# Patient Record
Sex: Male | Born: 2000 | Race: White | Hispanic: No | Marital: Single | Smoking: Never smoker
Health system: Southern US, Community
[De-identification: ages and names within clinical notes are randomized; demographics above are authoritative.]

---
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
## 2020-06-12 ENCOUNTER — Emergency Department (HOSPITAL_COMMUNITY)
Admission: EM | Admit: 2020-06-12 | Discharge: 2020-06-13 | Discharge status: Against Medical Advice | Payer: Medicaid - Out of State

## 2020-10-07 ENCOUNTER — Other Ambulatory Visit: Payer: Self-pay

## 2020-10-07 ENCOUNTER — Emergency Department (HOSPITAL_COMMUNITY): Payer: Self-pay

## 2020-10-07 ENCOUNTER — Encounter (HOSPITAL_COMMUNITY): Payer: Self-pay | Admitting: Emergency Medicine

## 2020-10-07 ENCOUNTER — Emergency Department (HOSPITAL_COMMUNITY)
Admission: EM | Admit: 2020-10-07 | Discharge: 2020-10-07 | Disposition: A | Discharge status: Home / Self Care | Payer: Self-pay | Attending: Emergency Medicine | Admitting: Emergency Medicine

## 2020-10-07 DIAGNOSIS — F1010 Alcohol abuse, uncomplicated: Secondary | ICD-10-CM | POA: Insufficient documentation

## 2020-10-07 DIAGNOSIS — R0789 Other chest pain: Secondary | ICD-10-CM | POA: Insufficient documentation

## 2020-10-07 DIAGNOSIS — R200 Anesthesia of skin: Secondary | ICD-10-CM

## 2020-10-07 DIAGNOSIS — R202 Paresthesia of skin: Secondary | ICD-10-CM | POA: Insufficient documentation

## 2020-10-07 LAB — CBC WITH DIFFERENTIAL/PLATELET
Abs Immature Granulocytes: 0.04 10*3/uL (ref 0.00–0.07)
Basophils Absolute: 0 10*3/uL (ref 0.0–0.1)
Basophils Relative: 0 %
Eosinophils Absolute: 0.1 10*3/uL (ref 0.0–0.5)
Eosinophils Relative: 1 %
HCT: 47.4 % (ref 39.0–52.0)
Hemoglobin: 16 g/dL (ref 13.0–17.0)
Immature Granulocytes: 0 %
Lymphocytes Relative: 12 %
Lymphs Abs: 1.3 10*3/uL (ref 0.7–4.0)
MCH: 30.4 pg (ref 26.0–34.0)
MCHC: 33.8 g/dL (ref 30.0–36.0)
MCV: 89.9 fL (ref 80.0–100.0)
Monocytes Absolute: 0.9 10*3/uL (ref 0.1–1.0)
Monocytes Relative: 8 %
Neutro Abs: 8.3 10*3/uL — ABNORMAL HIGH (ref 1.7–7.7)
Neutrophils Relative %: 79 %
Platelets: 271 10*3/uL (ref 150–400)
RBC: 5.27 MIL/uL (ref 4.22–5.81)
RDW: 13.3 % (ref 11.5–15.5)
WBC: 10.8 10*3/uL — ABNORMAL HIGH (ref 4.0–10.5)
nRBC: 0 % (ref 0.0–0.2)

## 2020-10-07 LAB — URINALYSIS, ROUTINE W REFLEX MICROSCOPIC
Bilirubin Urine: NEGATIVE
Glucose, UA: NEGATIVE mg/dL
Hgb urine dipstick: NEGATIVE
Ketones, ur: NEGATIVE mg/dL
Leukocytes,Ua: NEGATIVE
Nitrite: NEGATIVE
Protein, ur: NEGATIVE mg/dL
Specific Gravity, Urine: 1.018 (ref 1.005–1.030)
pH: 8 (ref 5.0–8.0)

## 2020-10-07 LAB — COMPREHENSIVE METABOLIC PANEL
ALT: 20 U/L (ref 0–44)
AST: 27 U/L (ref 15–41)
Albumin: 4.6 g/dL (ref 3.5–5.0)
Alkaline Phosphatase: 77 U/L (ref 38–126)
Anion gap: 9 (ref 5–15)
BUN: 14 mg/dL (ref 6–20)
CO2: 27 mmol/L (ref 22–32)
Calcium: 9.4 mg/dL (ref 8.9–10.3)
Chloride: 101 mmol/L (ref 98–111)
Creatinine, Ser: 0.84 mg/dL (ref 0.61–1.24)
GFR, Estimated: >60 mL/min (ref >60)
Glucose, Bld: 100 mg/dL — ABNORMAL HIGH (ref 70–99)
Potassium: 4.3 mmol/L (ref 3.5–5.1)
Sodium: 137 mmol/L (ref 135–145)
Total Bilirubin: 1 mg/dL (ref 0.3–1.2)
Total Protein: 7.5 g/dL (ref 6.5–8.1)

## 2020-10-07 LAB — RAPID URINE DRUG SCREEN, HOSP PERFORMED
Amphetamines: NOT DETECTED
Barbiturates: NOT DETECTED
Benzodiazepines: NOT DETECTED
Cocaine: NOT DETECTED
Opiates: NOT DETECTED
Tetrahydrocannabinol: POSITIVE — AB

## 2020-10-07 LAB — TROPONIN I (HIGH SENSITIVITY): Troponin I (High Sensitivity): 2 ng/L (ref <18)

## 2020-10-07 LAB — MAGNESIUM: Magnesium: 2 mg/dL (ref 1.7–2.4)

## 2020-10-07 LAB — PROTIME-INR
INR: 1.1 (ref 0.8–1.2)
Prothrombin Time: 14 seconds (ref 11.4–15.2)

## 2020-10-07 LAB — ETHANOL: Alcohol, Ethyl (B): <10 mg/dL (ref <10)

## 2020-10-07 MED ORDER — CHLORDIAZEPOXIDE HCL 25 MG PO CAPS: ORAL_CAPSULE | ORAL | 0 refills | Status: AC

## 2020-10-07 MED ORDER — LACTATED RINGERS IV BOLUS
1000.0000 mL | Freq: Once | INTRAVENOUS | Status: AC
  Administered 2020-10-07: 1000 mL via INTRAVENOUS

## 2020-10-07 MED ORDER — THIAMINE HCL 100 MG/ML IJ SOLN
100.0000 mg | Freq: Once | INTRAMUSCULAR | Status: AC
  Administered 2020-10-07: 100 mg via INTRAVENOUS
  Filled 2020-10-07: qty 2

## 2020-10-07 NOTE — ED Provider Notes (Signed)
[facility] EMERGENCY DEPARTMENT Provider Note   CSN: [id number] Arrival date & time: 10/07/20  1324     History Chief Complaint  Patient presents with  . Chest Pain    Victor Mason is a 19 y.o. male with no past medical history who presents today for evaluation of multiple complaints. His primary concern today is that he has paresthesias in his lips intermittently.  This has been developing over the past week and has become much more frequent until today when he looked in the mirror and saw that the corners of his mouth looked slightly blue.  This was reportedly also a concern voiced by his girlfriend.  He states that he has had intermittent chest tightness and pain.  He states that he has had for over 1 year pain anytime he lays down in his chest feels like something is pulling.   He also reports significant alcohol consumption.  He states that last night he had half of a 4 Loco and about 14 Corona beers.  He states that that is a relatively light night for him and that he will often drinks 24 beers.  This has been escalating over the past 2 to 3 months.  He states that he started using alcohol when he stopped smoking weed "as often."  He reports that he is not had any yellowing of his eyes, no nausea vomiting or diarrhea.  He states that after his first Covid shot he got a panic attack and has not gotten his second.  He denies any recent sick exposures or fevers.  No coughing.  His chest tightness sometimes happens when he is anxious however is not always when he feels anxious.  He denies any exposure to any benzocaine or similar products.  He denies any exposure to anything that may be designed to produce a tingling affect including that his girlfriend does not use any lip pumping blood clots or lipstick or other products.  He does not take any medications.  He admits that he does have a problem with alcohol use. He denies any IV drug use.  HPI     History reviewed. No pertinent  past medical history.  There are no problems to display for this patient.   History reviewed. No pertinent surgical history.     History reviewed. No pertinent family history.  Social History   Tobacco Use  . Smoking status: Never Smoker  . Smokeless tobacco: Never Used  Substance Use Topics  . Alcohol use: No  . Drug use: No    Home Medications Prior to Admission medications   Medication Sig Start Date End Date Taking? Authorizing Provider  chlordiazePOXIDE (LIBRIUM) 25 MG capsule 50mg  PO TID x 1D, then 25-50mg  PO BID X 1D, then 25-50mg  PO QD X 1D 10/07/20   10/09/20, PA-C  fexofenadine-pseudoephedrine (ALLEGRA-D) 60-120 MG per tablet Take 1 tablet by mouth every 12 (twelve) hours. 09/14/14   11/14/14, PA-C  ibuprofen (ADVIL,MOTRIN) 200 MG tablet Take 400 mg by mouth every 6 (six) hours as needed for moderate pain.    [provider]  promethazine-dextromethorphan (PROMETHAZINE-DM) 6.25-15 MG/5ML syrup 7ml po hs, or  q6h prn cough 09/14/14   11/14/14, PA-C    Allergies    Amoxicillin  Review of Systems   Review of Systems  Constitutional: Negative for chills and fever.  HENT: Negative for congestion.   Eyes: Negative for pain and visual disturbance.  Respiratory: Positive for chest tightness and shortness of breath.  Negative for cough.   Cardiovascular: Positive for chest pain and palpitations. Negative for leg swelling.  Gastrointestinal: Negative for abdominal pain, diarrhea, nausea and vomiting.  Genitourinary: Negative for dysuria and urgency.  Musculoskeletal: Negative for back pain and neck pain.  Neurological: Negative for weakness and headaches.       He notes that over the past week his left upper eyelid has been intermittently twitching.  Psychiatric/Behavioral: The patient is nervous/anxious.   All other systems reviewed and are negative.   Physical Exam Updated Vital Signs BP (!) 141/88   Pulse (!) 56   Temp 97.7 F  (36.5 C) (Oral)   Resp 14   Ht 6\' 1"  (1.854 m)   Wt 81.6 kg   SpO2 98%   BMI 23.75 kg/m   Physical Exam Vitals and nursing note reviewed.  Constitutional:      General: He is not in acute distress.    Appearance: He is well-developed.  HENT:     Head: Normocephalic and atraumatic.  Eyes:     Conjunctiva/sclera: Conjunctivae normal.  Cardiovascular:     Rate and Rhythm: Normal rate and regular rhythm.     Pulses:          Radial pulses are 2+ on the right side and 2+ on the left side.       Dorsalis pedis pulses are 2+ on the right side and 2+ on the left side.       Posterior tibial pulses are 2+ on the right side and 2+ on the left side.     Heart sounds: Normal heart sounds. No murmur heard.   Pulmonary:     Effort: Pulmonary effort is normal. No respiratory distress.     Breath sounds: Normal breath sounds. No decreased breath sounds, wheezing, rhonchi or rales.  Chest:     Chest wall: No tenderness.  Abdominal:     Palpations: Abdomen is soft.     Tenderness: There is no abdominal tenderness.  Musculoskeletal:     Cervical back: Neck supple.     Right lower leg: No tenderness. No edema.     Left lower leg: No tenderness. No edema.  Skin:    General: Skin is warm and dry.  Neurological:     General: No focal deficit present.     Mental Status: He is alert and oriented to person, place, and time.     Comments: No visible twitching of the eyelids including the left upper eyelid.  He reports subjective decreased sensation periorally however otherwise sensation is intact to light touch on bilateral face.  Spontaneous movement of all 4 extremities.  Coordination is grossly intact as he is able to text on his phone without difficulties.  Psychiatric:        Mood and Affect: Mood is anxious.        Behavior: Behavior normal.     ED Results / Procedures / Treatments   Labs (all labs ordered are listed, but only abnormal results are displayed) Labs Reviewed    COMPREHENSIVE METABOLIC PANEL - Abnormal; Notable for the following components:      Result Value   Glucose, Bld 100 (*)    All other components within normal limits  CBC WITH DIFFERENTIAL/PLATELET - Abnormal; Notable for the following components:   WBC 10.8 (*)    Neutro Abs 8.3 (*)    All other components within normal limits  RAPID URINE DRUG SCREEN, HOSP PERFORMED - Abnormal; Notable for the following components:  Tetrahydrocannabinol POSITIVE (*)    All other components within normal limits  ETHANOL  PROTIME-INR  MAGNESIUM  URINALYSIS, ROUTINE W REFLEX MICROSCOPIC  TROPONIN I (HIGH SENSITIVITY)    EKG EKG Interpretation  Date/Time:  Sunday October 07 2020 13:30:48 EDT Ventricular Rate:  63 PR Interval:    QRS Duration: 119 QT Interval:  407 QTC Calculation: 417 R Axis:   133 Text Interpretation: Sinus arrhythmia Nonspecific intraventricular conduction delay Anterior infarct, possibly acute diffuse ST elevation no reciprocal changes Confirmed by Eber Hong ([id number]) on 10/07/2020 1:33:29 PM   Radiology DG Chest Portable 1 View  Result Date: 10/07/2020 CLINICAL DATA:  Chest pain. EXAM: PORTABLE CHEST 1 VIEW COMPARISON:  09/14/2014 FINDINGS: The heart size and mediastinal contours are within normal limits. Both lungs are clear. No visible pleural effusions or pneumothorax. Upper thoracic reverse S-shaped curvature. No acute osseous abnormality. IMPRESSION: No acute cardiopulmonary disease. Electronically Signed   By: Feliberto Harts MD   On: 10/07/2020 14:48    Procedures Procedures (including critical care time)  Medications Ordered in ED Medications  thiamine (B-1) injection 100 mg (100 mg Intravenous Given 10/07/20 1428)  lactated ringers bolus 1,000 mL (0 mLs Intravenous Stopped 10/07/20 1651)    ED Course  I have reviewed the triage vital signs and the nursing notes.  Pertinent labs & imaging results that were available during my care of the patient were  reviewed by me and considered in my medical decision making (see chart for details).    MDM Rules/Calculators/A&P                         Patient is a 19 year old man who presents today for evaluation of multiple complaints. His primary concern was the constellation of twitching of his left upper eyelid, chest tightness, perioral paresthesias along with reports of his lips being blue earlier today, intermittent chest pains. He reports significant alcohol use.  He denies any benzocaine or other similar medications, doubt methemoglobinemia.  He reports that aside from alcohol and marijuana does not use any other drugs currently. Lungs are clear to auscultation bilaterally.  He is neurovascularly intact on my exam. He does appear anxious. Labs are obtained and reviewed showing mild leukocytosis at 10.8 however otherwise CBC and CMP are unremarkable. UDS is positive for THC. Troponin is not elevated. INR and LFTs are normal. Magnesium is normal. Ethanol is undetected. Patient was treated with IV thiamine and 1 L of IV fluids after which he stated that he felt significantly improved and ready to go home. We discussed that I suspect his symptoms are related to a combination of alcohol use, anxiety/stress along with dehydration. EKG does not have clear ischemia, given his troponin is 2 and his symptoms all resolved after IV fluids and thiamine doubt pericarditis, myocarditis or ischemic cause of his symptoms. We discussed options for stopping drinking. It sounds like that patient has used some substance in some sort for most of his adult life. I discussed need for counseling, patient states his understanding. We discussed role of Librium taper. Discussed not drinking while on Librium along with safety precautions. Librium Rx is sent. Work note is given.  Return precautions were discussed with patient who states their understanding.  At the time of discharge patient denied any unaddressed complaints or concerns.   Patient is agreeable for discharge home.  Note: Portions of this report may have been transcribed using voice recognition software. Every effort was made to ensure accuracy;  however, inadvertent computerized transcription errors may be present.    Final Clinical Impression(s) / ED Diagnoses Final diagnoses:  Atypical chest pain  Circumoral numbness  Alcohol abuse    Rx / DC Orders ED Discharge Orders         Ordered    chlordiazePOXIDE (LIBRIUM) 25 MG capsule        10/07/20 1552           Cristina GongHammond, Anvitha Hutmacher W, Cordelia Poche-C 10/07/20 2336    Eber HongMiller, Brian, MD 10/08/20 [phone]

## 2020-10-07 NOTE — ED Triage Notes (Signed)
Pt c/o of "chest fluttering" and lip numbness since yesterday

## 2020-10-07 NOTE — Discharge Instructions (Addendum)
You are being prescribed a medication which may make you sleepy. For 24 hours after one dose please do not drive, operate heavy machinery, care for a small child with out another adult present, or perform any activities that may cause harm to you or someone else if you were to fall asleep or be impaired.   Today we discussed that I suspect your symptoms are caused by primarily a combination of alcohol use along with dehydration and anxiety.  Given you the information to follow-up with the wellness clinic, to set up primary care.  Please call them Monday morning to set up an appointment.  I have given you a prescription for Librium, as we discussed this is a sedating medication however will try and help treat alcohol withdrawal related symptoms and significantly reduce your risk of having alcohol withdrawal related seizures.  Please make sure you tell someone who lives with you or a close friend what is going on so that they can check in on you.    Do not drink alcohol while you are taking Librium.  Please consider getting counseling or therapy in addition to help treat underlying reasons for why you may use substances. Additionally I would recommend that you stop using marijuana as, unless you grow unprocessed that yourself you do not know what other things may be mixed in.  Please start taking a multivitamin every day and eating a healthy, well-balanced diet.

## 2022-01-22 ENCOUNTER — Other Ambulatory Visit: Payer: Self-pay

## 2022-01-22 ENCOUNTER — Emergency Department (HOSPITAL_BASED_OUTPATIENT_CLINIC_OR_DEPARTMENT_OTHER)
Admission: EM | Admit: 2022-01-22 | Discharge: 2022-01-22 | Disposition: A | Discharge status: Home / Self Care | Payer: No Typology Code available for payment source | Attending: Emergency Medicine | Admitting: Emergency Medicine

## 2022-01-22 ENCOUNTER — Emergency Department (HOSPITAL_BASED_OUTPATIENT_CLINIC_OR_DEPARTMENT_OTHER): Payer: No Typology Code available for payment source | Admitting: Radiology

## 2022-01-22 DIAGNOSIS — Y9355 Activity, bike riding: Secondary | ICD-10-CM | POA: Insufficient documentation

## 2022-01-22 DIAGNOSIS — F10929 Alcohol use, unspecified with intoxication, unspecified: Secondary | ICD-10-CM | POA: Insufficient documentation

## 2022-01-22 DIAGNOSIS — M79671 Pain in right foot: Secondary | ICD-10-CM | POA: Insufficient documentation

## 2022-01-22 DIAGNOSIS — Y9289 Other specified places as the place of occurrence of the external cause: Secondary | ICD-10-CM | POA: Diagnosis not present

## 2022-01-22 MED ORDER — NAPROXEN 375 MG PO TABS
375.0000 mg | ORAL_TABLET | Freq: Two times a day (BID) | ORAL | 0 refills | Status: AC
Start: 2022-01-22 — End: ?

## 2022-01-22 MED ORDER — NAPROXEN 375 MG PO TABS: 375.0000 mg | ORAL_TABLET | Freq: Two times a day (BID) | ORAL | 0 refills | Status: DC
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
## 2022-01-22 NOTE — ED Notes (Signed)
Patient given discharge instructions. Questions were answered. Patient verbalized understanding of discharge instructions and care at home.  Patient right foot ace wrapped and boot placed.
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
## 2022-01-22 NOTE — Discharge Instructions (Signed)
You have been seen and discharged from the emergency department.  Your x-ray was negative for fracture.  You have a foot sprain.  Keep Ace wrap in place for compression support.  Elevate and ice the foot to avoid significant swelling.  Take medication as needed for pain control.  Follow-up with your primary provider for further evaluation and further care. Take home medications as prescribed. If you have any worsening symptoms or further concerns for your health please return to an emergency department for further evaluation.
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
## 2022-01-22 NOTE — ED Notes (Signed)
Ace wrap applied to right foot.

## 2022-05-19 IMAGING — DX DG FOOT COMPLETE 3+V*R*
2 series · 3 of 3 positions shown · non-contrast
Comparison: None.

CLINICAL DATA: Right foot pain after injury.

EXAM:
RIGHT FOOT COMPLETE - 3+ VIEW

[Series 1: foot · 0.14mm/px · 2 of 2 slices shown]
[im 1/2]
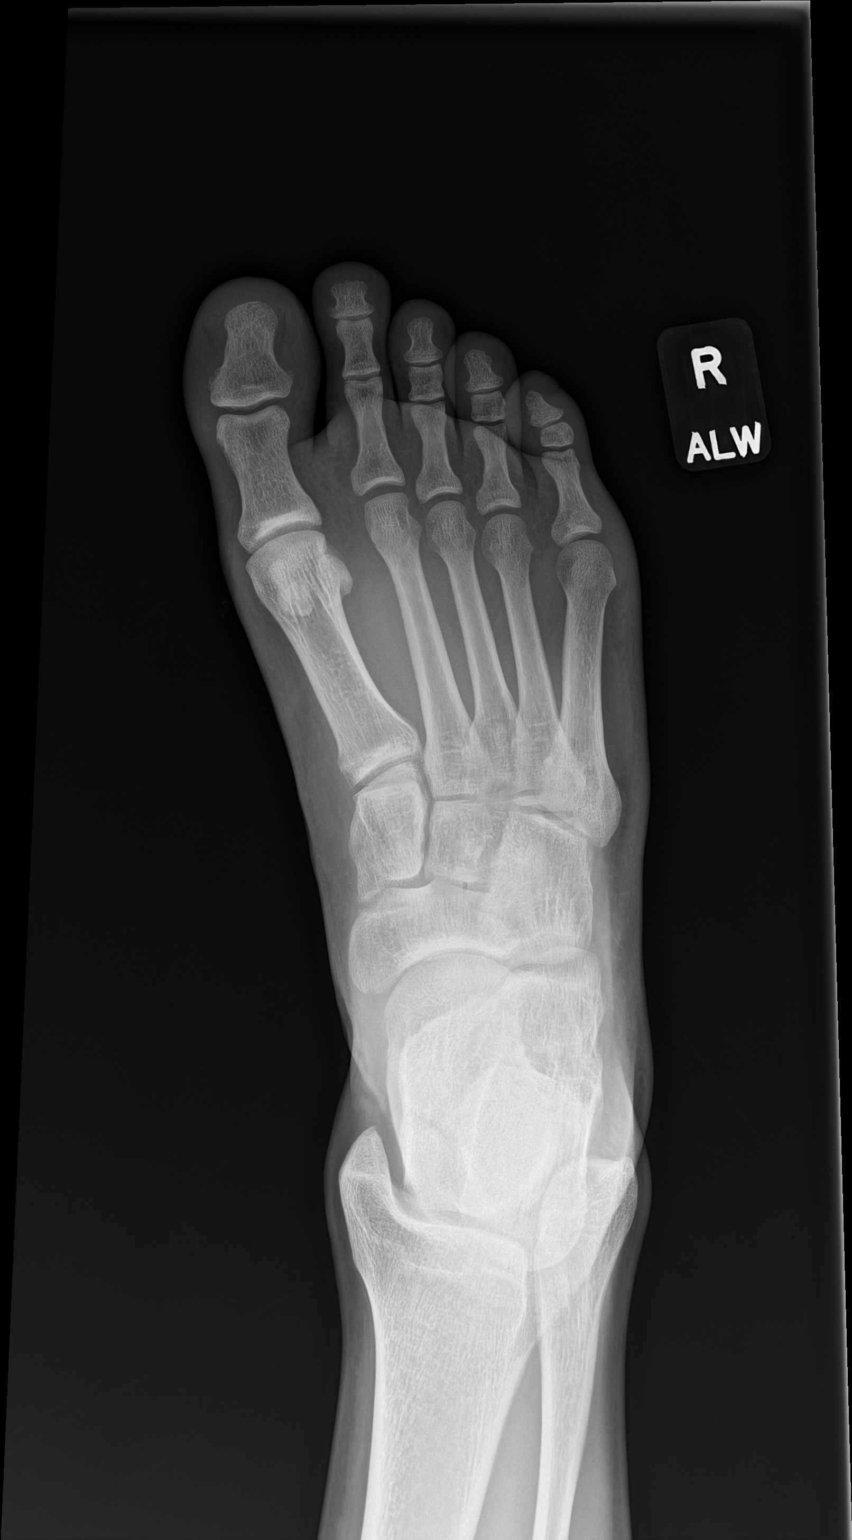
[im 2/2]
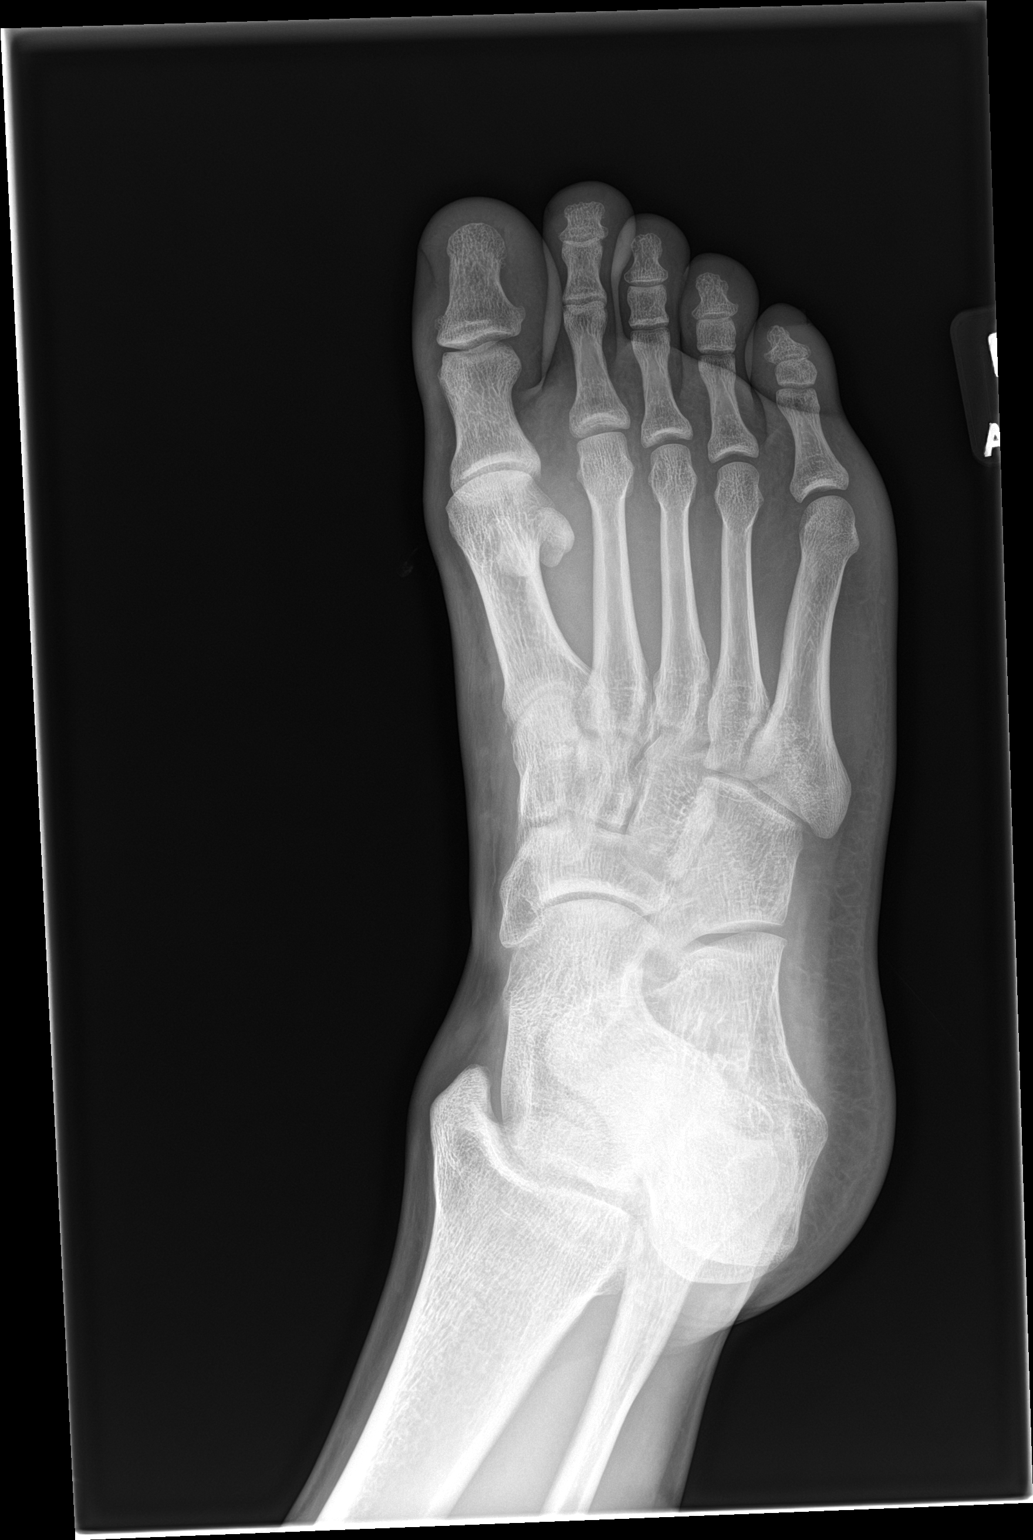

[leg]
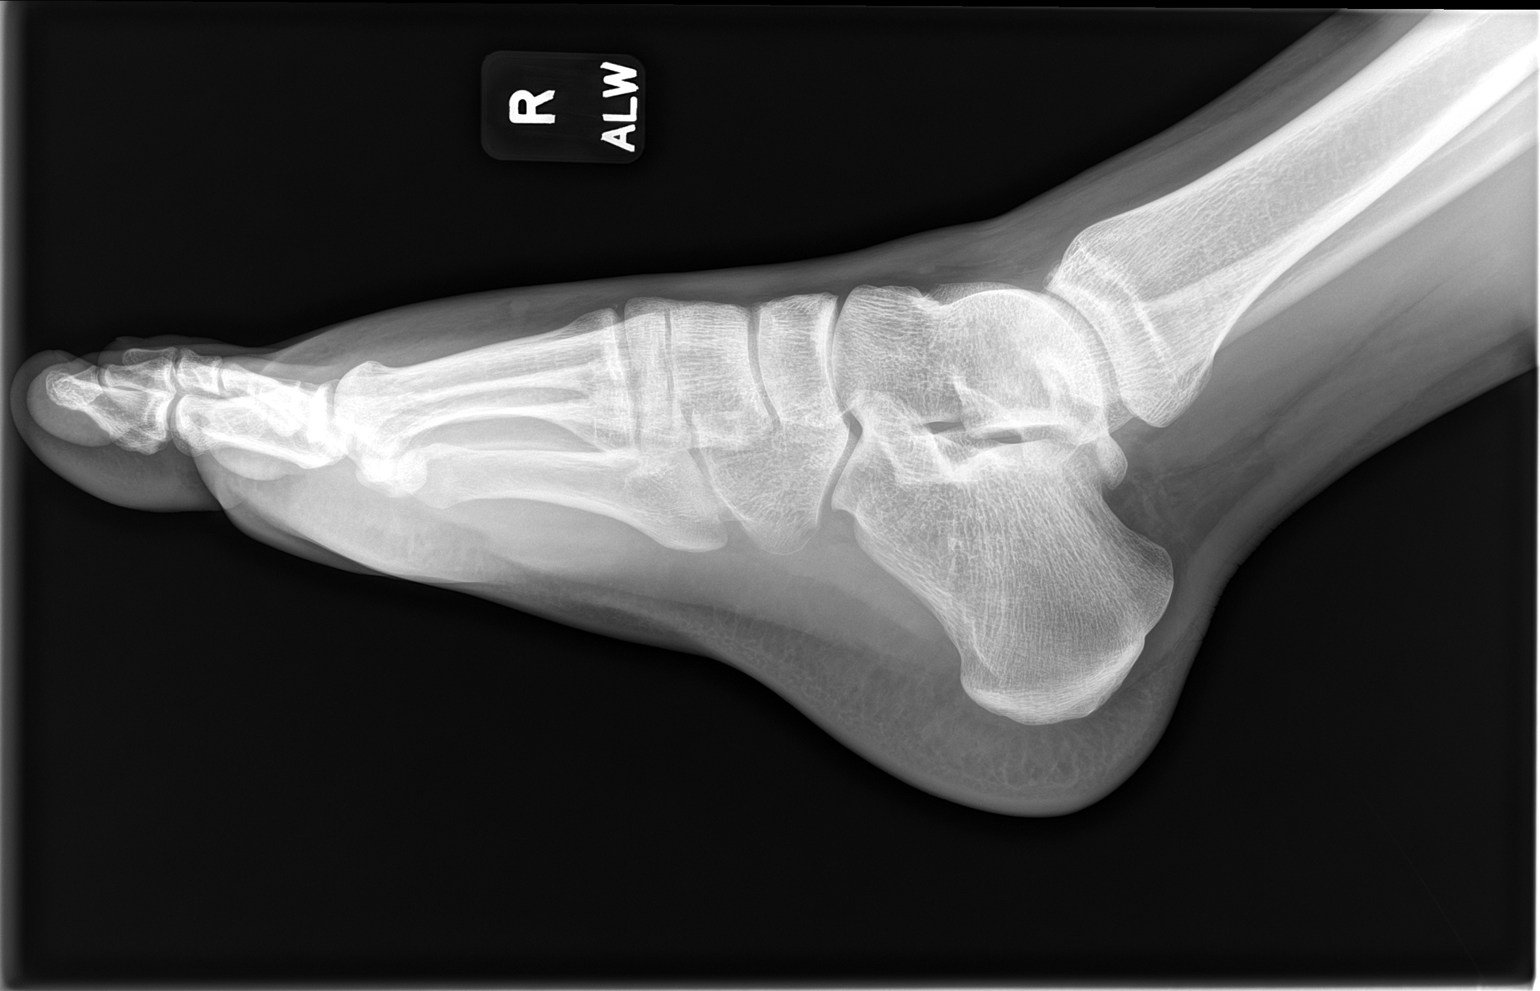

[3 of 3 positions shown; findings below may reference images not displayed]

FINDINGS: There is no evidence of fracture or dislocation. There is no
evidence of arthropathy or other focal bone abnormality. Soft
tissues are unremarkable.
IMPRESSION: Negative.

## 2023-04-03 ENCOUNTER — Ambulatory Visit
Admission: EM | Admit: 2023-04-03 | Discharge: 2023-04-03 | Disposition: A | Discharge status: Home / Self Care | Payer: Medicaid Other | Attending: Emergency Medicine | Admitting: Emergency Medicine

## 2023-04-03 DIAGNOSIS — R21 Rash and other nonspecific skin eruption: Secondary | ICD-10-CM | POA: Insufficient documentation

## 2023-04-03 DIAGNOSIS — Z113 Encounter for screening for infections with a predominantly sexual mode of transmission: Secondary | ICD-10-CM | POA: Diagnosis not present

## 2023-04-03 MED ORDER — VALACYCLOVIR HCL 1 G PO TABS: 1000.0000 mg | ORAL_TABLET | Freq: Three times a day (TID) | ORAL | 0 refills | Status: AC

## 2023-04-03 NOTE — ED Provider Notes (Signed)
Victor Mason    CSN: [id number] Arrival date & time: 04/03/23  1752      History   Chief Complaint Chief Complaint  Patient presents with   Rash    HPI Victor Mason is a 22 y.o. male.  Patient presents with a rash in his genital area with an ulcer on his penis x 1 week.  He now has a rash on his trunk and extremities.  He recently had unprotected sexual intercourse and is concerned for genital herpes.  He denies penile discharge, testicular pain, abdominal pain, dysuria, fever, or other symptoms.  No rash in his mouth.  He denies pertinent medical history.  The history is provided by the patient and medical records.    History reviewed. No pertinent past medical history.  There are no problems to display for this patient.   History reviewed. No pertinent surgical history.     Home Medications    Prior to Admission medications   Medication Sig Start Date End Date Taking? Authorizing Provider  valACYclovir (VALTREX) 1000 MG tablet Take 1 tablet (1,000 mg total) by mouth 3 (three) times daily. 04/03/23  Yes Mickie Bail, NP  chlordiazePOXIDE (LIBRIUM) 25 MG capsule  PO TID x 1D, then 25-50mg  PO BID X 1D, then 25-50mg  PO QD X 1D Patient not taking: Reported on 04/03/2023 10/07/20   Cristina Gong, PA-C  fexofenadine-pseudoephedrine (ALLEGRA-D) 60-120 MG per tablet Take 1 tablet by mouth every 12 (twelve) hours. Patient not taking: Reported on 04/03/2023 09/14/14   Ivery Quale, PA-C  naproxen (NAPROSYN) 375 MG tablet Take 1 tablet (375 mg total) by mouth 2 (two) times daily. Patient not taking: Reported on 04/03/2023 01/22/22   Horton, Clabe Seal, DO  promethazine-dextromethorphan (PROMETHAZINE-DM) 6.25-15 MG/5ML syrup 5ml po hs, or  q6h prn cough Patient not taking: Reported on 04/03/2023 09/14/14   Ivery Quale, PA-C    Family History No family history on file.  Social History Social History   Tobacco Use   Smoking status: Never   Smokeless tobacco:  Never  Substance Use Topics   Alcohol use: No   Drug use: No     Allergies   Amoxicillin   Review of Systems Review of Systems  Constitutional:  Negative for chills and fever.  HENT:  Negative for ear pain and sore throat.   Gastrointestinal:  Negative for abdominal pain, nausea and vomiting.  Genitourinary:  Negative for dysuria, flank pain, hematuria, penile discharge and testicular pain.  Skin:  Positive for rash.  All other systems reviewed and are negative.    Physical Exam Triage Vital Signs ED Triage Vitals  Enc Vitals Group     BP 04/03/23 1900 (!) 141/84     Pulse Rate 04/03/23 1842 76     Resp 04/03/23 1842 18     Temp 04/03/23 1842 98.2 F (36.8 C)     Temp src --      SpO2 04/03/23 1842 99 %     Weight 04/03/23 1858 180 lb (81.6 kg)     Height 04/03/23 1858  (1.88 m)     Head Circumference --      Peak Flow --      Pain Score 04/03/23 1855 4     Pain Loc --      Pain Edu? --      Excl. in GC? --    No data found.  Updated Vital Signs BP (!) 141/84   Pulse 76   Temp  98.2 F (36.8 C)   Resp 18   Ht 6\' 2"  (1.88 m)   Wt 180 lb (81.6 kg)   SpO2 99%   BMI 23.11 kg/m   Visual Acuity Right Eye Distance:   Left Eye Distance:   Bilateral Distance:    Right Eye Near:   Left Eye Near:    Bilateral Near:     Physical Exam Vitals and nursing note reviewed. Exam conducted with a chaperone present [name], Charity fundraiser).  Constitutional:      General: He is not in acute distress.    Appearance: Normal appearance. He is well-developed. He is not ill-appearing.  HENT:     Head: Normocephalic and atraumatic.     Mouth/Throat:     Mouth: Mucous membranes are moist.     Pharynx: Oropharynx is clear.  Cardiovascular:     Rate and Rhythm: Normal rate and regular rhythm.     Heart sounds: Normal heart sounds.  Pulmonary:     Effort: Pulmonary effort is normal. No respiratory distress.     Breath sounds: Normal breath sounds.  Abdominal:     General:  Bowel sounds are normal.     Palpations: Abdomen is soft.     Tenderness: There is no abdominal tenderness. There is no right CVA tenderness, left CVA tenderness, guarding or rebound.  Genitourinary:    Pubic Area: Rash present.     Testes: Normal.       Comments: Scattered papular rash on genital area.  Musculoskeletal:     Cervical back: Neck supple.  Skin:    General: Skin is warm and dry.     Findings: Rash present.     Comments: Scattered papular rash on trunk and extremities.   Neurological:     Mental Status: He is alert.  Psychiatric:        Mood and Affect: Mood normal.        Behavior: Behavior normal.      UC Treatments / Results  Labs (all labs ordered are listed, but only abnormal results are displayed) Labs Reviewed  HSV CULTURE AND TYPING  CYTOLOGY, (ORAL, ANAL, URETHRAL) ANCILLARY ONLY    EKG   Radiology No results found.  Procedures Procedures (including critical care time)  Medications Ordered in UC Medications - No data to display  Initial Impression / Assessment and Plan / UC Course  I have reviewed the triage vital signs and the nursing notes.  Pertinent labs & imaging results that were available during my care of the patient were reviewed by me and considered in my medical decision making (see chart for details).    Penile rash, body rash, STD screening.  Penile lesion swabbed for HSV.  Urethra swabbed for other STD testing.  Reading today with Valtrex.  Instructed patient to discontinue this medication if his HSV test is negative.  Instructed him to abstain from sexual activity for at least 7 days and until the rash is resolved.  Education provided on genital herpes and also on generalized body rash.  Instructed him to follow-up with his PCP.  He agrees to plan of care.  Final Clinical Impressions(s) / UC Diagnoses   Final diagnoses:  Penile rash  Rash  Screening for STD (sexually transmitted disease)     Discharge Instructions       Take the Valtrex as directed.    Your tests are pending.  If your test results are positive, we will call you.  You and your sexual partner(s) may  require treatment at that time.  Do not have sexual activity for at least 7 days and until the rash has resolved.         ED Prescriptions     Medication Sig Dispense Auth. Provider   valACYclovir (VALTREX) 1000 MG tablet Take 1 tablet (1,000 mg total) by mouth 3 (three) times daily. 21 tablet Mickie Bail, NP      PDMP not reviewed this encounter.   Mickie Bail, NP 04/03/23 1944

## 2023-04-03 NOTE — Discharge Instructions (Addendum)
Take the Valtrex as directed.    Your tests are pending.  If your test results are positive, we will call you.  You and your sexual partner(s) may require treatment at that time.  Do not have sexual activity for at least 7 days and until the rash has resolved.

## 2023-04-03 NOTE — ED Triage Notes (Addendum)
Patient to Urgent Care with complaints of rash present to hands and legs that started several days ago. Small lesions present to palms of hands/ wrist/ knee/ shins. States that the areas are painful/ sore but has not noticed any drainage. Also has sores present to his genital area. Denies any fevers/ other symptoms.   Reports concerns that he was exposed to something.   Reports only known allergy is to amoxicillin.

## 2023-04-06 ENCOUNTER — Emergency Department (HOSPITAL_COMMUNITY)
Admission: EM | Admit: 2023-04-06 | Discharge: 2023-04-07 | Disposition: A | Discharge status: Home / Self Care | Payer: Medicaid Other | Attending: Emergency Medicine | Admitting: Emergency Medicine

## 2023-04-06 ENCOUNTER — Other Ambulatory Visit: Payer: Self-pay

## 2023-04-06 ENCOUNTER — Encounter (HOSPITAL_COMMUNITY): Payer: Self-pay | Admitting: *Deleted

## 2023-04-06 DIAGNOSIS — Z114 Encounter for screening for human immunodeficiency virus [HIV]: Secondary | ICD-10-CM | POA: Insufficient documentation

## 2023-04-06 DIAGNOSIS — R21 Rash and other nonspecific skin eruption: Secondary | ICD-10-CM | POA: Insufficient documentation

## 2023-04-06 DIAGNOSIS — Z7251 High risk heterosexual behavior: Secondary | ICD-10-CM | POA: Insufficient documentation

## 2023-04-06 LAB — CYTOLOGY, (ORAL, ANAL, URETHRAL) ANCILLARY ONLY
Chlamydia: NEGATIVE
Comment: NEGATIVE
Comment: NEGATIVE
Comment: NORMAL
Neisseria Gonorrhea: NEGATIVE
Trichomonas: NEGATIVE

## 2023-04-06 NOTE — ED Triage Notes (Signed)
Pt was seen a week ago for std exposure. Tested negative some STIs. Since then, has noted bumps on his arms, legs, penis and chest. Was placed on valacyclovir prophylaxis

## 2023-04-07 LAB — HSV CULTURE AND TYPING

## 2023-04-07 LAB — RPR: RPR Ser Ql: NONREACTIVE

## 2023-04-07 LAB — HIV ANTIBODY (ROUTINE TESTING W REFLEX): HIV Screen 4th Generation wRfx: NONREACTIVE

## 2023-04-07 MED ORDER — ONDANSETRON 4 MG PO TBDP
4.0000 mg | ORAL_TABLET | Freq: Once | ORAL | Status: AC
  Administered 2023-04-07: 4 mg via ORAL
  Filled 2023-04-07: qty 1

## 2023-04-07 MED ORDER — AZITHROMYCIN 250 MG PO TABS
2000.0000 mg | ORAL_TABLET | Freq: Once | ORAL | Status: AC
Start: 2023-04-07 — End: 2023-04-07
  Administered 2023-04-07: 2000 mg via ORAL
  Filled 2023-04-07: qty 8

## 2023-04-07 NOTE — Discharge Instructions (Addendum)
Continue Valacyclovir as prescribed until your herpes tests result. You have syphilis and HIV testing pending. Follow up on these results in MyChart.  If you test positive for an STD, notify all sexual partners of their need to be tested and treated for sexually transmitted infections.  Use condoms when sexually active, but refrain from sexual intercourse until 1 week following completion of all STD treatments.  Return for new or concerning symptoms.
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
